# Patient Record
Sex: Male | Born: 1980 | Race: Black or African American | Hispanic: No | Marital: Married | Smoking: Never smoker
Health system: Southern US, Community
[De-identification: ages and names within clinical notes are randomized; demographics above are authoritative.]

---

## 2012-11-02 ENCOUNTER — Emergency Department: Payer: Self-pay | Admitting: Internal Medicine

## 2015-02-17 ENCOUNTER — Ambulatory Visit: Payer: Self-pay | Admitting: Family Medicine

## 2015-03-08 ENCOUNTER — Ambulatory Visit: Payer: Self-pay | Admitting: Family Medicine

## 2015-03-18 ENCOUNTER — Ambulatory Visit: Payer: Self-pay

## 2015-04-02 ENCOUNTER — Ambulatory Visit
Admit: 2015-04-02 | Disposition: A | Discharge status: Home / Self Care | Payer: Self-pay | Attending: Obstetrics and Gynecology | Admitting: Obstetrics and Gynecology

## 2016-12-17 ENCOUNTER — Ambulatory Visit: Admission: EM | Admit: 2016-12-17 | Discharge: 2016-12-17 | Discharge status: Against Medical Advice | Payer: Self-pay

## 2016-12-17 NOTE — ED Triage Notes (Signed)
Pt reports he had oral sex with a male partner 2 weeks ago and has had a right sided sore throat since that time. Denies URI sx, denies fever, no cough, no other sx. Pain 5/10
